# Patient Record
Sex: Male | Born: 1995 | Hispanic: Yes | Marital: Single | Smoking: Never smoker
Health system: Southern US, Community
[De-identification: ages and names within clinical notes are randomized; demographics above are authoritative.]

---
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
## 2015-07-31 ENCOUNTER — Emergency Department (HOSPITAL_COMMUNITY)
Admission: EM | Admit: 2015-07-31 | Discharge: 2015-07-31 | Disposition: A | Discharge status: Home / Self Care | Payer: Self-pay | Attending: Emergency Medicine | Admitting: Emergency Medicine

## 2015-07-31 ENCOUNTER — Encounter (HOSPITAL_COMMUNITY): Payer: Self-pay | Admitting: *Deleted

## 2015-07-31 ENCOUNTER — Emergency Department (HOSPITAL_COMMUNITY): Payer: Self-pay

## 2015-07-31 DIAGNOSIS — R112 Nausea with vomiting, unspecified: Secondary | ICD-10-CM | POA: Insufficient documentation

## 2015-07-31 DIAGNOSIS — E876 Hypokalemia: Secondary | ICD-10-CM | POA: Insufficient documentation

## 2015-07-31 DIAGNOSIS — R1033 Periumbilical pain: Secondary | ICD-10-CM | POA: Insufficient documentation

## 2015-07-31 LAB — CBC
HCT: 44.7 % (ref 39.0–52.0)
HEMOGLOBIN: 15.5 g/dL (ref 13.0–17.0)
MCH: 30.3 pg (ref 26.0–34.0)
MCHC: 34.7 g/dL (ref 30.0–36.0)
MCV: 87.3 fL (ref 78.0–100.0)
PLATELETS: 271 10*3/uL (ref 150–400)
RBC: 5.12 MIL/uL (ref 4.22–5.81)
RDW: 12.5 % (ref 11.5–15.5)
WBC: 15.3 10*3/uL — ABNORMAL HIGH (ref 4.0–10.5)

## 2015-07-31 LAB — COMPREHENSIVE METABOLIC PANEL
ALT: 52 U/L (ref 17–63)
ANION GAP: 17 — AB (ref 5–15)
AST: 77 U/L — ABNORMAL HIGH (ref 15–41)
Albumin: 4.5 g/dL (ref 3.5–5.0)
Alkaline Phosphatase: 74 U/L (ref 38–126)
BUN: 18 mg/dL (ref 6–20)
CHLORIDE: 94 mmol/L — AB (ref 101–111)
CO2: 25 mmol/L (ref 22–32)
CREATININE: 0.95 mg/dL (ref 0.61–1.24)
Calcium: 9.6 mg/dL (ref 8.9–10.3)
GFR calc non Af Amer: >60 mL/min (ref >60)
Glucose, Bld: 137 mg/dL — ABNORMAL HIGH (ref 65–99)
Potassium: 2.7 mmol/L — CL (ref 3.5–5.1)
SODIUM: 136 mmol/L (ref 135–145)
Total Bilirubin: 1.3 mg/dL — ABNORMAL HIGH (ref 0.3–1.2)
Total Protein: 8.5 g/dL — ABNORMAL HIGH (ref 6.5–8.1)

## 2015-07-31 LAB — BASIC METABOLIC PANEL
Anion gap: 11 (ref 5–15)
BUN: 14 mg/dL (ref 6–20)
CALCIUM: 8.7 mg/dL — AB (ref 8.9–10.3)
CO2: 27 mmol/L (ref 22–32)
CREATININE: 0.81 mg/dL (ref 0.61–1.24)
Chloride: 97 mmol/L — ABNORMAL LOW (ref 101–111)
GFR calc Af Amer: >60 mL/min (ref >60)
GFR calc non Af Amer: >60 mL/min (ref >60)
GLUCOSE: 103 mg/dL — AB (ref 65–99)
Potassium: 3.5 mmol/L (ref 3.5–5.1)
Sodium: 135 mmol/L (ref 135–145)

## 2015-07-31 LAB — URINALYSIS, ROUTINE W REFLEX MICROSCOPIC
BILIRUBIN URINE: NEGATIVE
GLUCOSE, UA: NEGATIVE mg/dL
Hgb urine dipstick: NEGATIVE
Leukocytes, UA: NEGATIVE
Nitrite: NEGATIVE
PH: 7.5 (ref 5.0–8.0)
PROTEIN: NEGATIVE mg/dL
Specific Gravity, Urine: 1.01 (ref 1.005–1.030)

## 2015-07-31 LAB — LIPASE, BLOOD: LIPASE: 36 U/L (ref 11–51)

## 2015-07-31 MED ORDER — POTASSIUM CHLORIDE CRYS ER 20 MEQ PO TBCR: 40.0000 meq | EXTENDED_RELEASE_TABLET | Freq: Three times a day (TID) | ORAL | Status: DC

## 2015-07-31 MED ORDER — MORPHINE SULFATE (PF) 4 MG/ML IV SOLN
4.0000 mg | Freq: Once | INTRAVENOUS | Status: AC
  Administered 2015-07-31: 4 mg via INTRAVENOUS
  Filled 2015-07-31: qty 1

## 2015-07-31 MED ORDER — CIPROFLOXACIN HCL 500 MG PO TABS: 500.0000 mg | ORAL_TABLET | Freq: Two times a day (BID) | ORAL | Status: AC

## 2015-07-31 MED ORDER — ONDANSETRON HCL 4 MG PO TABS: 4.0000 mg | ORAL_TABLET | Freq: Four times a day (QID) | ORAL | Status: AC

## 2015-07-31 MED ORDER — METRONIDAZOLE 500 MG PO TABS: 500.0000 mg | ORAL_TABLET | Freq: Three times a day (TID) | ORAL | Status: AC

## 2015-07-31 MED ORDER — ACETAMINOPHEN 500 MG PO TABS: 500.0000 mg | ORAL_TABLET | Freq: Four times a day (QID) | ORAL | Status: AC | PRN

## 2015-07-31 MED ORDER — ONDANSETRON 4 MG PO TBDP
4.0000 mg | ORAL_TABLET | Freq: Once | ORAL | Status: AC | PRN
  Administered 2015-07-31: 4 mg via ORAL

## 2015-07-31 MED ORDER — ONDANSETRON 4 MG PO TBDP
ORAL_TABLET | ORAL | Status: AC
  Filled 2015-07-31: qty 1

## 2015-07-31 MED ORDER — POTASSIUM CHLORIDE 10 MEQ/100ML IV SOLN
10.0000 meq | INTRAVENOUS | Status: AC
  Administered 2015-07-31 (×2): 10 meq via INTRAVENOUS
  Filled 2015-07-31 (×2): qty 100

## 2015-07-31 MED ORDER — POTASSIUM CHLORIDE CRYS ER 20 MEQ PO TBCR: 20.0000 meq | EXTENDED_RELEASE_TABLET | Freq: Every day | ORAL | Status: AC

## 2015-07-31 MED ORDER — POTASSIUM CHLORIDE CRYS ER 20 MEQ PO TBCR
40.0000 meq | EXTENDED_RELEASE_TABLET | Freq: Once | ORAL | Status: AC
  Administered 2015-07-31: 40 meq via ORAL
  Filled 2015-07-31: qty 2

## 2015-07-31 MED ORDER — IOPAMIDOL (ISOVUE-300) INJECTION 61%
INTRAVENOUS | Status: AC
  Administered 2015-07-31: 100 mL
  Filled 2015-07-31: qty 100

## 2015-07-31 MED ORDER — SODIUM CHLORIDE 0.9 % IV BOLUS (SEPSIS)
1000.0000 mL | Freq: Once | INTRAVENOUS | Status: AC
  Administered 2015-07-31: 1000 mL via INTRAVENOUS

## 2015-07-31 NOTE — ED Provider Notes (Signed)
CSN: [id number]     Arrival date & time 07/31/15  1508 History   First MD Initiated Contact with Patient 07/31/15 1900     Chief Complaint  Patient presents with  . Abdominal Pain  . Emesis     (Consider location/radiation/quality/duration/timing/severity/associated sxs/prior Treatment) HPI Anthony Ray is an otherwise healthy 20 y.o. male who presents with sudden onset, constant, worsening, sharp, non-radiating periumbilical abdominal pain that began Friday approximately 5 PM (3 days ago).  No aggravating factors.  No modifying factors.  No meds PTA.  Associated symptoms include NBNB emesis.  He reports he's seems to have had about 7-8 episodes every hour since his abdominal pain started.  He reports he did have a period of time on Saturday when he stopped for a while, but it has since began again.  Denies fever, chills, cough, CP, SOB, diarrhea, change in BMs, melena, hematochezia, or urinary symptoms.  No hx of abdominal surgeries, recent travel, or abx.  Denies drugs, alcohol, or tobacco use.  Last BM this morning and normal.  He received Zofran in triage and states that it has relieved his nausea, and he denies emesis since arrival.   History reviewed. No pertinent past medical history. No past surgical history on file. No family history on file. Social History  Substance Use Topics  . Smoking status: Never Smoker   . Smokeless tobacco: None  . Alcohol Use: No    Review of Systems All other systems negative unless otherwise stated in HPI    Allergies  Review of patient's allergies indicates no known allergies.  Home Medications   Prior to Admission medications   Not on File   BP 118/74 mmHg  Pulse 64  Temp(Src) 97.9 F (36.6 C) (Oral)  Resp 21  Ht 5' 6.5" (1.689 m)  Wt 70.308 kg  BMI 24.65 kg/m2  SpO2 100% Physical Exam  Constitutional: He is oriented to person, place, and time. He appears well-developed and well-nourished.  Non-toxic appearance. He does not have a  sickly appearance. He does not appear ill.  HENT:  Head: Normocephalic and atraumatic.  Mouth/Throat: Oropharynx is clear and moist.  Eyes: Conjunctivae are normal. Pupils are equal, round, and reactive to light.  Neck: Normal range of motion. Neck supple.  Cardiovascular: Normal rate and regular rhythm.   Pulmonary/Chest: Effort normal and breath sounds normal. No accessory muscle usage or stridor. No respiratory distress. He has no wheezes. He has no rhonchi. He has no rales.  Abdominal: Soft. Bowel sounds are normal. He exhibits no distension. There is tenderness. There is no rigidity, no rebound and no guarding.    Musculoskeletal: Normal range of motion.  Lymphadenopathy:    He has no cervical adenopathy.  Neurological: He is alert and oriented to person, place, and time.  Speech clear without dysarthria.  Skin: Skin is warm and dry.  Psychiatric: He has a normal mood and affect. His behavior is normal.    ED Course  Procedures (including critical care time) Labs Review Labs Reviewed  COMPREHENSIVE METABOLIC PANEL - Abnormal; Notable for the following:    Potassium 2.7 (*)    Chloride 94 (*)    Glucose, Bld 137 (*)    Total Protein 8.5 (*)    AST 77 (*)    Total Bilirubin 1.3 (*)    Anion gap 17 (*)    All other components within normal limits  CBC - Abnormal; Notable for the following:    WBC 15.3 (*)  All other components within normal limits  URINALYSIS, ROUTINE W REFLEX MICROSCOPIC (NOT AT [facility]) - Abnormal; Notable for the following:    Ketones, ur >80 (*)    All other components within normal limits  LIPASE, BLOOD    Imaging Review Ct Abdomen Pelvis W Contrast  07/31/2015  CLINICAL DATA:  PT HAVING SEVERE LT MID ABD, BASICALLY IN STOMACH REGION INTO PERIUMBILICAL REGION N/V ALSO EXAM: CT ABDOMEN AND PELVIS WITH CONTRAST TECHNIQUE: Multidetector CT imaging of the abdomen and pelvis was performed using the standard protocol following bolus administration of  intravenous contrast. CONTRAST:  ISOVUE-300 IOPAMIDOL (ISOVUE-300) INJECTION 61% COMPARISON:  None. FINDINGS: Lower chest: No pulmonary nodules, pleural effusions, or infiltrates. Heart size is normal. No imaged pericardial effusion or significant coronary artery calcifications. Upper abdomen: No focal abnormality identified within the liver, spleen, pancreas, or adrenal glands. The gallbladder is present. Kidneys are symmetric in size and enhancement. No hydronephrosis. Gastrointestinal tract: The stomach has a normal appearance and is distended with fluid. Multiple segments of small bowel showed thickened wall. Other segments are mildly dilated. The terminal ileum itself has a normal appearance. There is no free intraperitoneal air. Colonic loops are normal in appearance. The appendix is well seen and has a normal appearance. Pelvis: Urinary bladder is normal in appearance. Prostate gland and seminal vesicles are normal in appearance. No free pelvic fluid. Retroperitoneum: No evidence for aortic aneurysm. There are numerous small mesenteric lymph nodes possibly reactive. Abdominal wall: Unremarkable. Osseous structures: Unremarkable. IMPRESSION: 1. Multiple loops of small bowel with thickened wall alternating with other loops of small bowel which are mildly dilated. Findings raise a question of inflammatory or infectious process. 2. Normal appendix. 3. No abscess. Electronically Signed   By: Norva Pavlov M.D.   On: 07/31/2015 21:07   I have personally reviewed and evaluated these images and lab results as part of my medical decision-making.   EKG Interpretation   Date/Time:  Sunday Jul 31 2015 19:24:40 EDT Ventricular Rate:  77 PR Interval:  145 QRS Duration: 93 QT Interval:  424 QTC Calculation: 480 R Axis:   73 Text Interpretation:  Age not entered, assumed to be  20 years old for  purpose of ECG interpretation Sinus arrhythmia RSR' in V1 or V2, probably  normal variant Borderline  prolonged QT interval early repolarization. no  old comparison Confirmed by Donnald Garre, MD, Lebron Conners [phone]) on 07/31/2015  8:10:45 PM      MDM   Final diagnoses:  Hypokalemia  Periumbilical abdominal pain  Non-intractable vomiting with nausea, vomiting of unspecified type   Patient presents with periumbilical abdominal pain and emesis.  VSS, NAD.  He appears well.  On exam, heart RRR, lungs CTAB, abdomen soft with periumbilical, RLQ/suprapubic/LLQ abdominal pain.  No rigidity, rebound, or guarding.  Doubt surgical abdomen.  Will give IVF and morphine for pain.  Labs show K 2.7, Cl 94, and AG 17, this is likely secondary to vomiting.  Will give 2 runs of IV 10 meq K and 40 meq PO K.  AST 77, bili 1.3.  WBC 15.3.  DDx includes choledocholithiasis, cholecystitis, cholangitis, PUD, appendicitis, gastroenteritis, colitis.  Will obtain CT abdomen/pelvis.    9 PM: Upon reassessment, patient's pain improved and denies nausea.   9:15 PM: CT abdomen/pelvis shows multiple loops of small bowel with thickened wall alternating with other loops of small bowel which are mildly dilated.  Findings raise a question of inflammatory or infectious process.  Normal appendix.  No abscess.  Will consult general surgery.  Appreciate Dr. Lindie SpruceWyatt, no surgical indication, this can be seen in gastroenteritis or inflammatory bowel.  UA without signs of infection, ketonuria, likely secondary to dehydration.  Plan to PO challenge.  After IV K and 2L fluids finish will need i-stat chem 8 to re-check K and anion gap.  If improved and patient able to tolerate PO, plan to discharge home with Potassium, Cipro, Flagyl, Zofran, and Tylenol.  10 PM: Patient care hand off to Dr. Donnald GarrePfeiffer at shift change who will follow up on PO challenge and repeat chem 8.     Cheri FowlerKayla Sophiarose Eades, PA-C 07/31/15 2209  Arby BarretteMarcy Pfeiffer, MD 08/06/15 [phone]

## 2015-07-31 NOTE — ED Notes (Signed)
Pt verbalized understanding of discharge instructions and follow up care

## 2015-07-31 NOTE — ED Notes (Signed)
Pt states acute onset periumbilical pain and emesis since Friday.  Denies diarrhea.

## 2015-07-31 NOTE — ED Notes (Signed)
Dr Donnald GarrePfeiffer notified of K of 2.7.

## 2015-07-31 NOTE — ED Notes (Signed)
Pt given crackers and gingerale.

## 2015-07-31 NOTE — Discharge Instructions (Signed)
Your CT scan today showed that your abdominal pain is likely related to gastroenteritis or an inflammation in small bowel. This is treated with antibiotics. Take Cipro twice daily and metronidazole 3 times daily for the next 7 days. Take Zofran every 6 hours as needed for nausea. Take Tylenol every 6 hours for pain. Your labs also showed low potassium, I believe this is due to your excessive vomiting. We repleted it here in the ED. However, please take 40 mEq of potassium 3 times daily for 1 day. Please return to the emergency room if you experience nausea and continued vomiting, without being able to keep fluids down, or if you experience severe worsening of your abdominal pain.  Abdominal Pain, Adult Many things can cause abdominal pain. Usually, abdominal pain is not caused by a disease and will improve without treatment. It can often be observed and treated at home. Your health care provider will do a physical exam and possibly order blood tests and X-rays to help determine the seriousness of your pain. However, in many cases, more time must pass before a clear cause of the pain can be found. Before that point, your health care provider may not know if you need more testing or further treatment. HOME CARE INSTRUCTIONS Monitor your abdominal pain for any changes. The following actions may help to alleviate any discomfort you are experiencing:  Only take over-the-counter or prescription medicines as directed by your health care provider.  Do not take laxatives unless directed to do so by your health care provider.  Try a clear liquid diet (broth, tea, or water) as directed by your health care provider. Slowly move to a bland diet as tolerated. SEEK MEDICAL CARE IF:  You have unexplained abdominal pain.  You have abdominal pain associated with nausea or diarrhea.  You have pain when you urinate or have a bowel movement.  You experience abdominal pain that wakes you in the night.  You have  abdominal pain that is worsened or improved by eating food.  You have abdominal pain that is worsened with eating fatty foods.  You have a fever. SEEK IMMEDIATE MEDICAL CARE IF:  Your pain does not go away within 2 hours.  You keep throwing up (vomiting).  Your pain is felt only in portions of the abdomen, such as the right side or the left lower portion of the abdomen.  You pass bloody or black tarry stools. MAKE SURE YOU:  Understand these instructions.  Will watch your condition.  Will get help right away if you are not doing well or get worse.   This information is not intended to replace advice given to you by your health care provider. Make sure you discuss any questions you have with your health care provider.   Document Released: 12/13/2004 Document Revised: 11/24/2014 Document Reviewed: 11/12/2012 Elsevier Interactive Patient Education [employer]2016 [employer]

## 2018-01-10 IMAGING — CT CT ABD-PELV W/ CM
2 of 4 series · 16 of 46 positions shown, 18 images · IV contrast (Omni 300)
Comparison: None.

CLINICAL DATA: PT HAVING SEVERE LT MID ABD, BASICALLY IN STOMACH
REGION INTO PERIUMBILICAL REGION N/V ALSO

EXAM:
CT ABDOMEN AND PELVIS WITH CONTRAST
TECHNIQUE: Multidetector CT imaging of the abdomen and pelvis was performed
using the standard protocol following bolus administration of
intravenous contrast.
CONTRAST:  100mL 2V0V1G-SOO IOPAMIDOL (2V0V1G-SOO) INJECTION 61%

[Series 2: a/p w/ 5mm · axial · 0.71mm/px · z∈[+772,+1197]mm · 13 of 93 slices shown, 15 images]
[im 4/93  soft-tissue]
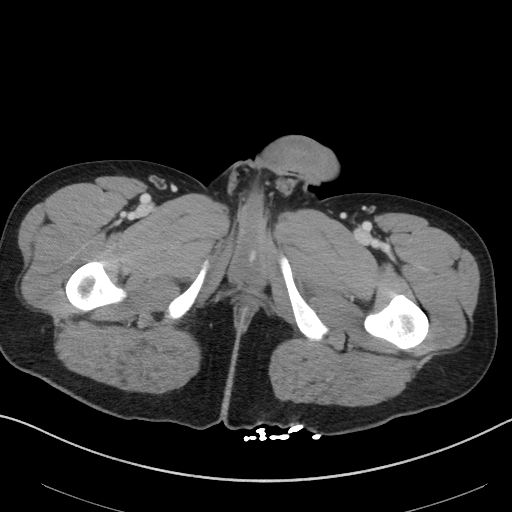
[im 4/93  bone]
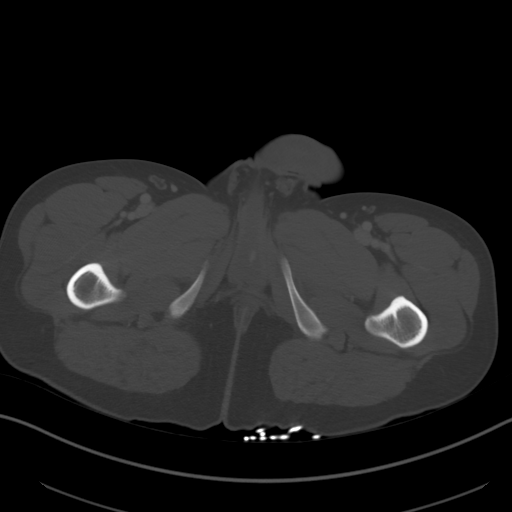
[im 12/93  soft-tissue]
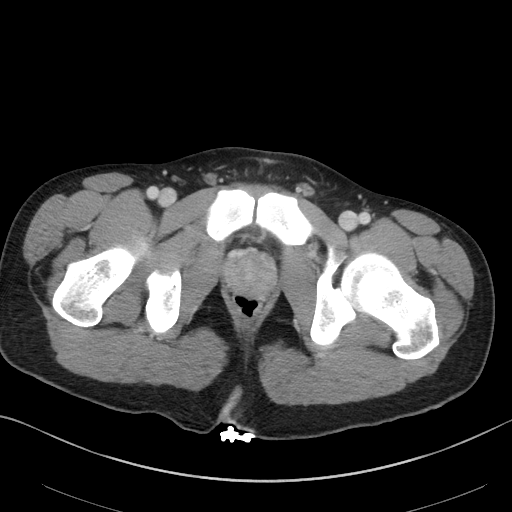
[im 20/93  soft-tissue]
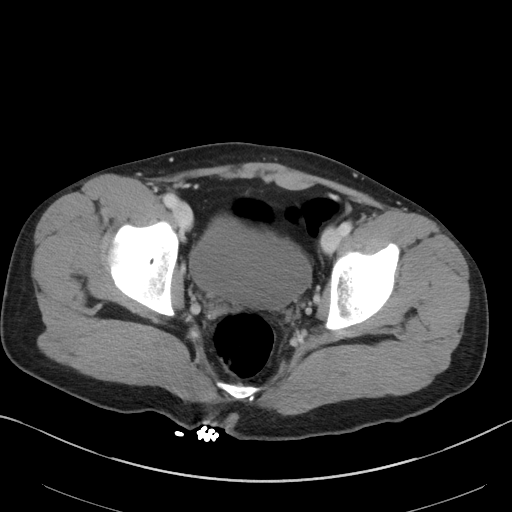
[im 27/93  soft-tissue]
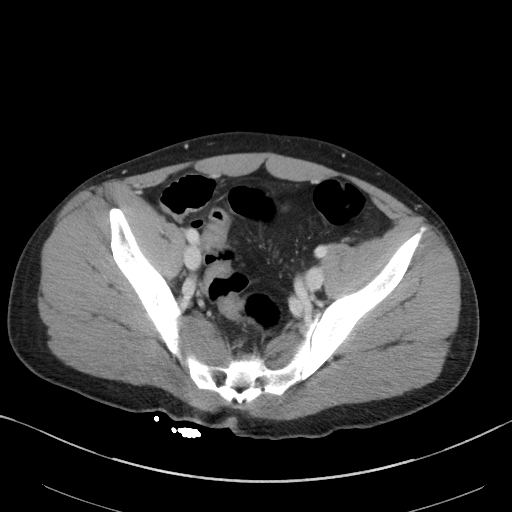
[im 31/93  soft-tissue]
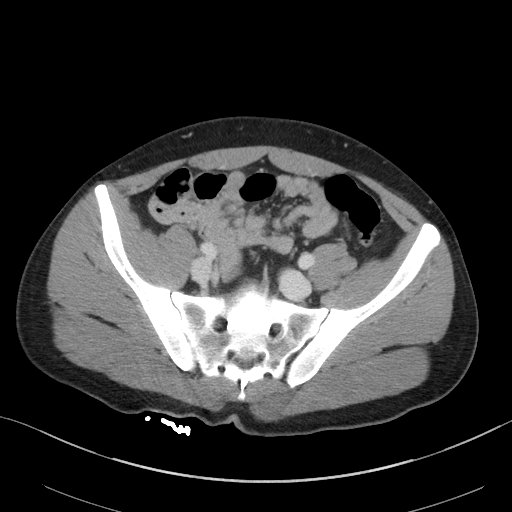
[im 39/93  soft-tissue]
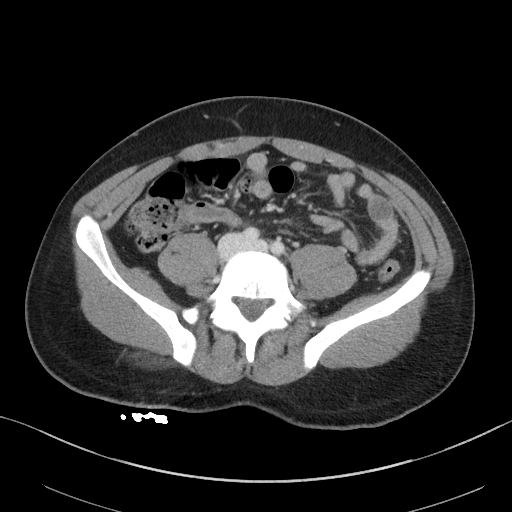
[im 47/93  soft-tissue]
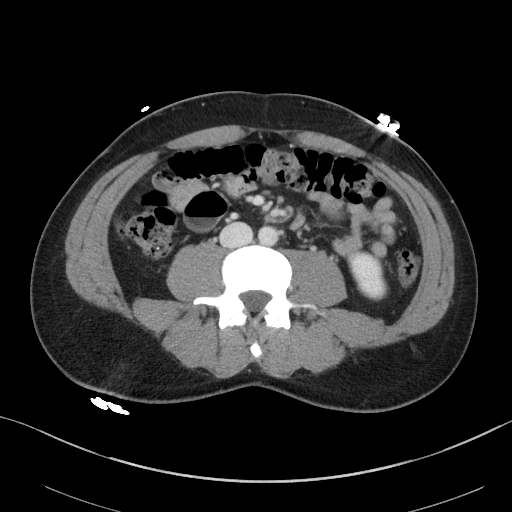
[im 54/93  soft-tissue]
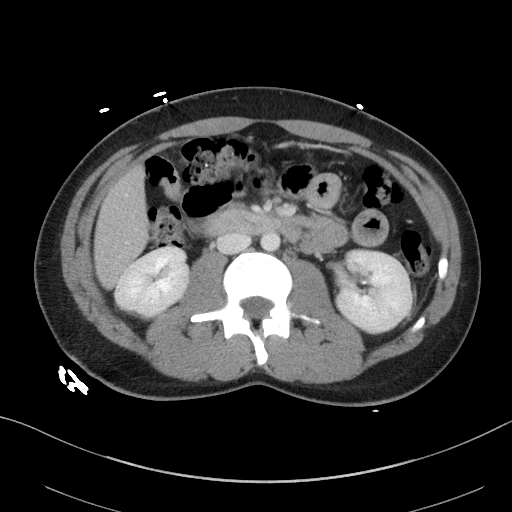
[im 62/93  soft-tissue]
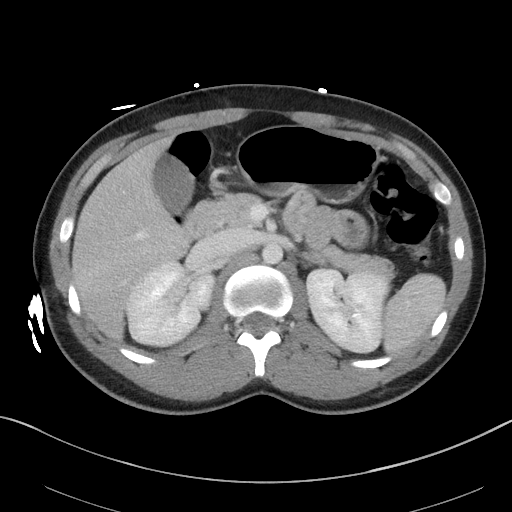
[im 62/93  bone]
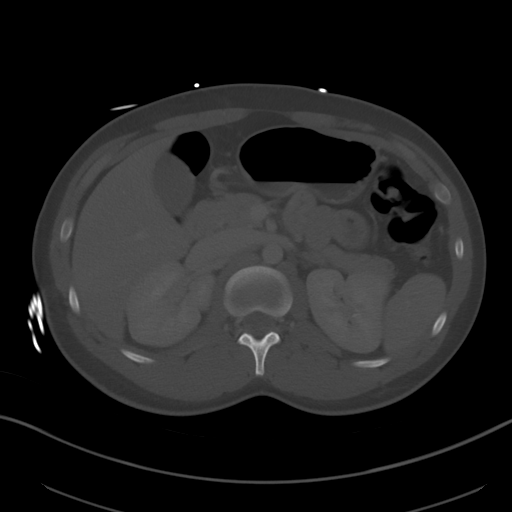
[im 66/93  soft-tissue]
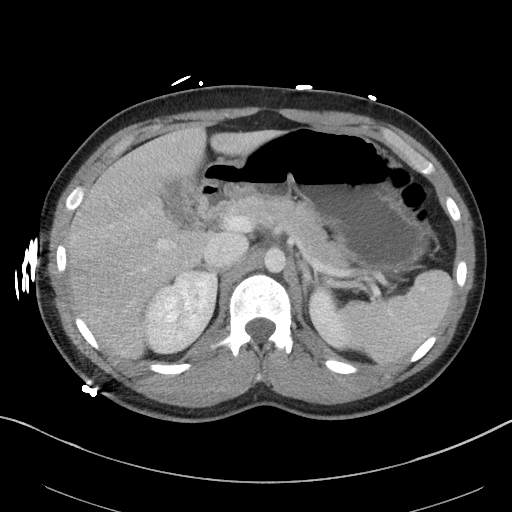
[im 73/93  soft-tissue]
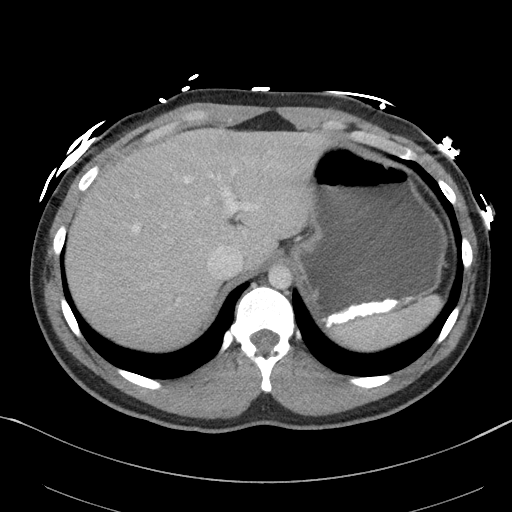
[im 81/93  soft-tissue]
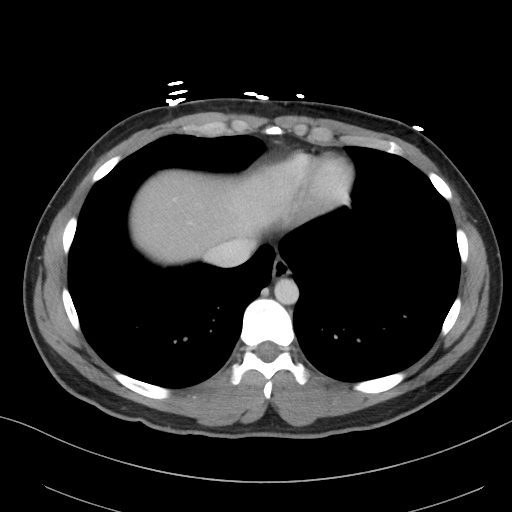
[im 89/93  soft-tissue]
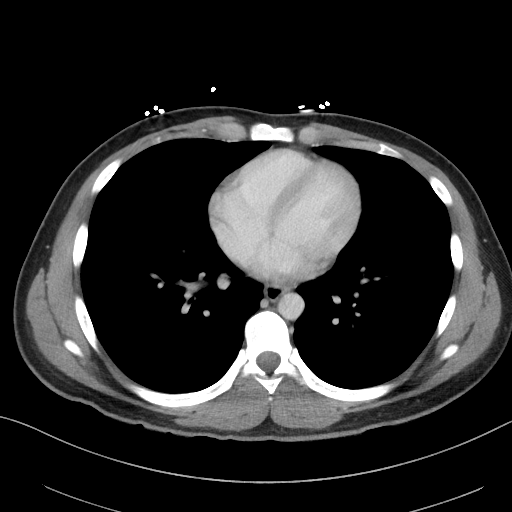

[Series 4: a/p w/ cor · coronal · 0.74mm/px · 3 of 118 slices shown]
[im 40/118  soft-tissue]
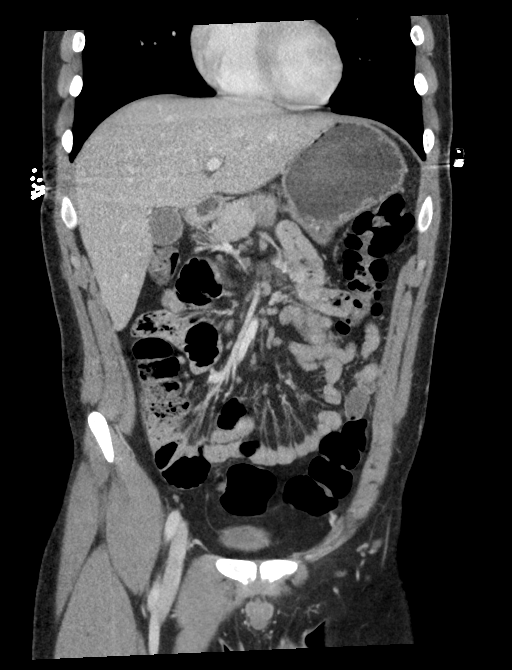
[im 53/118  soft-tissue]
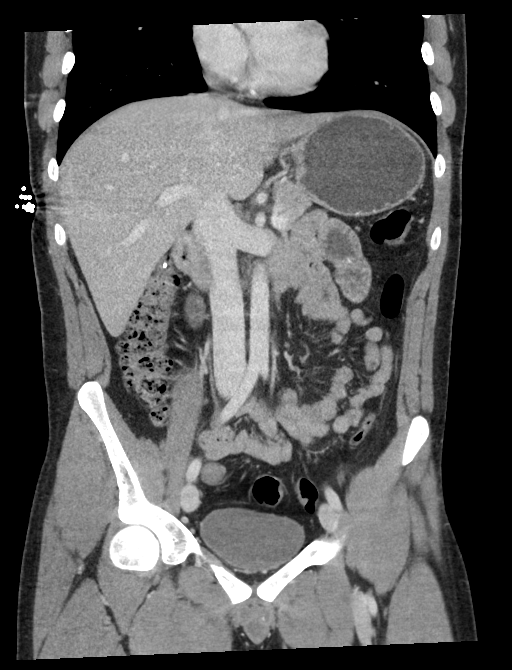
[im 66/118  soft-tissue]
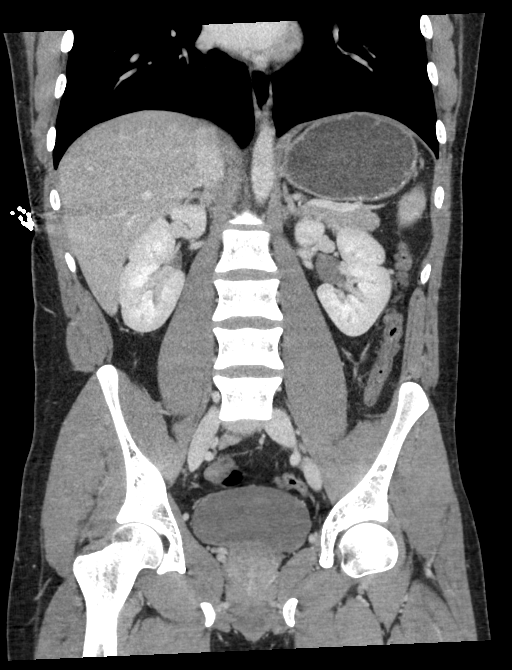

[16 of 46 positions shown; findings below may reference images not displayed]

FINDINGS: Lower chest: No pulmonary nodules, pleural effusions, or
infiltrates. Heart size is normal. No imaged pericardial effusion or
significant coronary artery calcifications.

Upper abdomen: No focal abnormality identified within the liver,
spleen, pancreas, or adrenal glands. The gallbladder is present.
Kidneys are symmetric in size and enhancement. No hydronephrosis.

Gastrointestinal tract: The stomach has a normal appearance and is
distended with fluid. Multiple segments of small bowel showed
thickened wall. Other segments are mildly dilated. The terminal
ileum itself has a normal appearance. There is no free
intraperitoneal air. Colonic loops are normal in appearance. The
appendix is well seen and has a normal appearance.

Pelvis: Urinary bladder is normal in appearance. Prostate gland and
seminal vesicles are normal in appearance. No free pelvic fluid.

Retroperitoneum: No evidence for aortic aneurysm. There are numerous
small mesenteric lymph nodes possibly reactive.

Abdominal wall: Unremarkable.

Osseous structures: Unremarkable.
IMPRESSION: 1. Multiple loops of small bowel with thickened wall alternating
with other loops of small bowel which are mildly dilated. Findings
raise a question of inflammatory or infectious process.
2. Normal appendix.
3. No abscess.
